# Patient Record
Sex: Female | Born: 1975 | Race: White | Hispanic: No | Marital: Married | Smoking: Never smoker
Health system: Southern US, Community
[De-identification: ages and names within clinical notes are randomized; demographics above are authoritative.]

## PROBLEM LIST (undated history)

## (undated) DIAGNOSIS — Z789 Other specified health status: Secondary | ICD-10-CM

## (undated) HISTORY — PX: MOUTH SURGERY: SHX715

---

## 2004-12-21 ENCOUNTER — Emergency Department: Payer: Self-pay | Admitting: Emergency Medicine

## 2006-05-15 ENCOUNTER — Emergency Department: Payer: Self-pay | Admitting: Emergency Medicine

## 2014-03-25 ENCOUNTER — Encounter: Payer: Self-pay | Admitting: Obstetrics and Gynecology

## 2014-05-06 ENCOUNTER — Encounter: Payer: Self-pay | Admitting: Obstetrics and Gynecology

## 2014-09-17 ENCOUNTER — Emergency Department
Admit: 2014-09-17 | Disposition: A | Discharge status: Home / Self Care | Payer: Self-pay | Admitting: Emergency Medicine

## 2014-09-17 ENCOUNTER — Observation Stay: Payer: Self-pay | Admitting: Obstetrics & Gynecology

## 2014-10-13 ENCOUNTER — Observation Stay
Admit: 2014-10-13 | Disposition: A | Discharge status: Home / Self Care | Payer: Self-pay | Attending: Obstetrics and Gynecology | Admitting: Obstetrics and Gynecology

## 2014-10-14 ENCOUNTER — Inpatient Hospital Stay
Admit: 2014-10-14 | Disposition: A | Discharge status: Home / Self Care | Payer: Self-pay | Attending: Certified Nurse Midwife | Admitting: Certified Nurse Midwife

## 2014-10-14 LAB — CBC WITH DIFFERENTIAL/PLATELET
BASOS ABS: 0 10*3/uL (ref 0.0–0.1)
Basophil %: 0.2 %
Eosinophil #: 0.1 10*3/uL (ref 0.0–0.7)
Eosinophil %: 0.6 %
HCT: 34.3 % — ABNORMAL LOW (ref 35.0–47.0)
HGB: 11.4 g/dL — ABNORMAL LOW (ref 12.0–16.0)
LYMPHS ABS: 1.3 10*3/uL (ref 1.0–3.6)
LYMPHS PCT: 14.5 %
MCH: 28.1 pg (ref 26.0–34.0)
MCHC: 33.1 g/dL (ref 32.0–36.0)
MCV: 85 fL (ref 80–100)
MONOS PCT: 5.2 %
Monocyte #: 0.5 x10 3/mm (ref 0.2–0.9)
NEUTROS PCT: 79.5 %
Neutrophil #: 7 10*3/uL — ABNORMAL HIGH (ref 1.4–6.5)
PLATELETS: 193 10*3/uL (ref 150–440)
RBC: 4.04 10*6/uL (ref 3.80–5.20)
RDW: 13.8 % (ref 11.5–14.5)
WBC: 8.8 10*3/uL (ref 3.6–11.0)

## 2014-10-14 LAB — DRUG SCREEN, URINE
Amphetamines, Ur Screen: NEGATIVE
BARBITURATES, UR SCREEN: NEGATIVE
Benzodiazepine, Ur Scrn: NEGATIVE
CANNABINOID 50 NG, UR ~~LOC~~: POSITIVE
COCAINE METABOLITE, UR ~~LOC~~: NEGATIVE
MDMA (ECSTASY) UR SCREEN: NEGATIVE
Methadone, Ur Screen: NEGATIVE
Opiate, Ur Screen: NEGATIVE
PHENCYCLIDINE (PCP) UR S: NEGATIVE
Tricyclic, Ur Screen: NEGATIVE

## 2014-10-15 LAB — HEMATOCRIT: HCT: 31.7 % — ABNORMAL LOW (ref 35.0–47.0)

## 2014-10-29 NOTE — H&P (Signed)
L&D Evaluation:  History:  HPI 39 yo G3P2002 with EDC=10/06/2014 presents at 5556w1d gestational age by 10 week ultrasound for IOL due to postdates. PNC at [facility] complicated by marijuana use (last positive UDS 3/26),  advanced maternal age, and GERD. Pelvis proven to 8#13 oz. LABS: A POS, RI, VNI, GBS negative. She notes positive fetal movement, no leakage of fluid, no vaginal bleeding, and occasional contractions. Desires to breast feed. [facility]   Presents with postdates for IOL   Patient's Medical History GERD   Patient's Surgical History wisdome teeth extraction   Medications Pre Natal Vitamins  nexium 20mg  po daily   Allergies Codeine, hydrocodone (nausea/emesis) though states that she has taken vicodin without problem   Social History notes +marijuana use. none recently. denies EtOH and tobacco use   Family History Non-Contributory   ROS:  ROS All systems were reviewed.  HEENT, CNS, GI, GU, Respiratory, CV, Renal and Musculoskeletal systems were found to be normal., x 10 systems reveiwed unless noted in HPI   Exam:  Vital Signs afebrile, normotensive, all others normal   General no apparent distress   Mental Status clear   Chest clear   Heart normal sinus rhythm   Abdomen gravid, non-tender   Estimated Fetal Weight 7.5 pounds   Fetal Position cephalic by leopolds   Back no CVAT   Edema no edema   Pelvic no external lesions, 3/70/-1/vtx on US. Clitoral ring-advised to remove   Mebranes Intact   FHT normal rate with no decels   FHT Description 110/mod var/+accels to 150s to 160s/no decels   Fetal Heart Rate 115    Ucx irregular   Skin ulcerative lesions (~0.5cm) on bilateral legs, no excoriations noted   Lymph no lymphadenopathy   Impression:  Impression 1) Intrauterine pregnancy at 2256w1d gestational age, 2) elderly multigravida, 3) reactive NST   Plan:  Plan EFM/NST, monitor contractions and for cervical change, Pitocin induction explained  to patient and she agrees with POM. Desires epidural when more active. UDS   Electronic Signatures: Trinna BalloonGutierrez, Rebecca Cairns L (CNM)  (Signed 25-Apr-16 09:31)  Authored: L&D Evaluation   Last Updated: 25-Apr-16 09:31 by Trinna BalloonGutierrez, Kaeleen Odom L (CNM)

## 2014-10-29 NOTE — H&P (Signed)
L&D Evaluation:  History Expanded:  HPI 39 yo G3P2002 at 7125w1d gestational age by 10 week ultrasound.  Pregnancy complicated by marijuana use, advanced maternal age. She presents today for an NST. She notes positive fetal movement, no leakage of fluid, no vaginal bleeding, and occasional contractions.   Gravida 3   Term 2   PreTerm 0   Abortion 0   Living 2   Blood Type (Maternal) A positive   Group B Strep Results Maternal (Result >5wks must be treated as unknown) negative  09/12/14   Maternal HIV Negative   Maternal Syphilis Ab Nonreactive   Maternal Varicella Non-Immune   Rubella Results (Maternal) immune   [facility]   Patient's Medical History GERD   Patient's Surgical History wisdome teeth extraction   Medications Pre Natal Vitamins  nexium 20mg  po daily   Allergies Codeine, hydrocodone (nausea/emesis) though states that she has taken vicodin without problem   Social History notes +marijuana use. none recently. denies EtOH and tobacco use   Family History Non-Contributory   ROS:  ROS All systems were reviewed.  HEENT, CNS, GI, GU, Respiratory, CV, Renal and Musculoskeletal systems were found to be normal., x 10 systems reveiwed unless noted in HPI   Exam:  Vital Signs afebrile, normotensive, all others normal   General no apparent distress   Mental Status clear   Chest clear   Heart normal sinus rhythm   Abdomen gravid, non-tender   Estimated Fetal Weight 7.5 pounds   Fetal Position cephalic by leopolds   Back no CVAT   Edema no edema   Pelvic no external lesions, 3/50/-2   Mebranes Intact   FHT normal rate with no decels   FHT Description 130/mod var/+accels/no decels   Ucx irregular, 2-3 q 10 min   Skin ulcerative lesions (~0.5cm) on bilateral legs, no excoriations noted   Lymph no lymphadenopathy   Impression:  Impression 1) Intrauterine pregnancy at 6525w1d gestational age, 2) elderly multigravida, 3) reactive NST    Plan:  Comments 1) labor: schedule for induction for tomorrow 8am.  Labor precautions reviewed.  G1 at 41 weeks, spontaneous vaginal delivery, female, weight 8lb13oz. G2 at 42 weeks, spontaneous vaginal delivery, female, 7 lb 8oz.  2) Fetal well being: reassuring with reactive NST, observed for 30 min, +15x15 accels noted, no decels, baseline and variability, as above 3) GBS negative 4) +history of marijuan use.  Recommend urine drug screen on admission tomorrow. 5) dispo: home. return at 8am tomorrow for induction.  Ok with L&D.   Follow Up Appointment already scheduled   Electronic Signatures: Conard NovakJackson, Dartanyon Frankowski D (MD)  (Signed 24-Apr-16 13:24)  Authored: L&D Evaluation   Last Updated: 24-Apr-16 13:24 by Conard NovakJackson, Ioana Louks D (MD)

## 2017-06-21 NOTE — L&D Delivery Note (Signed)
Obstetrical Delivery Note   Date of Delivery:   05/05/2018 Primary OB:   [facility] Gestational Age/EDD: 2526w0d (Dated by LMP) Antepartum complications: AMA, anemia, possible early alcohol use, marijuana use  Delivered By:   Farrel Connersolleen Leontae Bostock, CNM  Delivery Type:   spontaneous vaginal delivery  Procedure Details:   CTSP due to variable decelerations. Cervix 8-9/90%/0-+1. With next contraction, cervix became complete/C/+1 to +2. Mother pushed to deliver a vigorous female infant in [place] with nuchal cord x1 and right nuchal hand. Nuchal cord reduced on perineum. Baby with good cry.  Bulb suctioned then placed on mother's abdomen and dried. After delayed cord clamping, mother cut cord. Baby placed skin to skin with mother. Spontaneous delivery of intact placenta and 3 vessel cord. Some lower uterine atony treated with Cytotec 800 mcg PR. EBL 400 ml. No perineal or cervical lacerations Anesthesia:    epidural Intrapartum complications: VAriable deceleration with nuchal cord x 1 GBS:    negative Laceration:    none Episiotomy:    none Placenta:    Via active 3rd stage. To pathology: no Estimated Blood Loss:  400 ml Baby:    Liveborn female, Apgars 8/9, weight 6#8.1oz    Farrel Connersolleen Genie Mirabal, CNM

## 2017-10-04 LAB — OB RESULTS CONSOLE HIV ANTIBODY (ROUTINE TESTING): HIV: NONREACTIVE

## 2017-10-05 ENCOUNTER — Other Ambulatory Visit: Payer: Self-pay | Admitting: Advanced Practice Midwife

## 2017-10-05 DIAGNOSIS — Z369 Encounter for antenatal screening, unspecified: Secondary | ICD-10-CM

## 2017-10-05 LAB — OB RESULTS CONSOLE ABO/RH: RH TYPE: POSITIVE

## 2017-10-05 LAB — OB RESULTS CONSOLE HGB/HCT, BLOOD
HEMATOCRIT: 36 (ref 29–41)
HEMOGLOBIN: 12.5

## 2017-10-05 LAB — OB RESULTS CONSOLE RUBELLA ANTIBODY, IGM: Rubella: IMMUNE

## 2017-10-05 LAB — OB RESULTS CONSOLE HEPATITIS B SURFACE ANTIGEN: Hepatitis B Surface Ag: NEGATIVE

## 2017-10-05 LAB — OB RESULTS CONSOLE VARICELLA ZOSTER ANTIBODY, IGG: Varicella: NON-IMMUNE/NOT IMMUNE

## 2017-10-06 LAB — OB RESULTS CONSOLE GC/CHLAMYDIA
Chlamydia: NEGATIVE
GC PROBE AMP, GENITAL: NEGATIVE

## 2017-10-24 ENCOUNTER — Ambulatory Visit (HOSPITAL_BASED_OUTPATIENT_CLINIC_OR_DEPARTMENT_OTHER)
Admission: RE | Admit: 2017-10-24 | Discharge: 2017-10-24 | Disposition: A | Discharge status: Home / Self Care | Payer: Medicaid Other | Source: Ambulatory Visit | Attending: Obstetrics and Gynecology | Admitting: Obstetrics and Gynecology

## 2017-10-24 ENCOUNTER — Ambulatory Visit
Admission: RE | Admit: 2017-10-24 | Discharge: 2017-10-24 | Disposition: A | Discharge status: Home / Self Care | Payer: Medicaid Other | Source: Ambulatory Visit | Attending: Advanced Practice Midwife | Admitting: Advanced Practice Midwife

## 2017-10-24 VITALS — BP 104/66 | HR 79 | Temp 98.1°F | Resp 17 | Ht 67.0 in | Wt 149.8 lb

## 2017-10-24 DIAGNOSIS — O09521 Supervision of elderly multigravida, first trimester: Secondary | ICD-10-CM | POA: Insufficient documentation

## 2017-10-24 DIAGNOSIS — Z369 Encounter for antenatal screening, unspecified: Secondary | ICD-10-CM

## 2017-10-24 NOTE — Progress Notes (Addendum)
Referring Provider:   [facility] Department Length of Consultation: 40 minutes  Ms. Theriault was referred to [facility] for genetic counseling because of advanced maternal age.  The patient will be 42 years old at the time of delivery.  This note summarizes the information we discussed.    We explained that the chance of a chromosome abnormality increases with maternal age.  Chromosomes and examples of chromosome problems were reviewed.  Humans typically have 46 chromosomes in each cell, with half passed through each sperm and egg.  Any change in the number or structure of chromosomes can increase the risk of problems in the physical and mental development of a pregnancy.   Based upon age of the patient and the current gestational age, the chance of any chromosome abnormality was 1 in 34. The chance of Down syndrome, the most common chromosome problem associated with maternal age, was 1 in 67.  The risk of chromosome problems is in addition to the 3% general population risk for birth defects and mental retardation.  The greatest chance, of course, is that the baby would be born in good health.  We discussed the following prenatal screening and testing options for this pregnancy:  First trimester screening, which includes nuchal translucency ultrasound screen and first trimester maternal serum marker screening.  The nuchal translucency has approximately an 80% detection rate for Down syndrome and can be positive for other chromosome abnormalities as well as heart defects.  When combined with a maternal serum marker screening, the detection rate is up to 90% for Down syndrome and up to 97% for trisomy 18.     The chorionic villus sampling procedure is available for first trimester chromosome analysis.  This involves the withdrawal of a small amount of chorionic villi (tissue from the developing placenta).  Risk of pregnancy loss is estimated to be approximately 1  in 200 to 1 in 100 (0.5 to 1%).  There is approximately a 1% (1 in 100) chance that the CVS chromosome results will be unclear.  Chorionic villi cannot be tested for neural tube defects.     Maternal serum marker screening, a blood test that measures pregnancy proteins, can provide risk assessments for Down syndrome, trisomy 18, and open neural tube defects (spina bifida, anencephaly). Because it does not directly examine the fetus, it cannot positively diagnose or rule out these problems.  Targeted ultrasound uses high frequency sound waves to create an image of the developing fetus.  An ultrasound is often recommended as a routine means of evaluating the pregnancy.  It is also used to screen for fetal anatomy problems (for example, a heart defect) that might be suggestive of a chromosomal or other abnormality.   Amniocentesis involves the removal of a small amount of amniotic fluid from the sac surrounding the fetus with the use of a thin needle inserted through the maternal abdomen and uterus.  Ultrasound guidance is used throughout the procedure.  Fetal cells from amniotic fluid are directly evaluated and > 99.5% of chromosome problems and > 98% of open neural tube defects can be detected. This procedure is generally performed after the 15th week of pregnancy.  The main risks to this procedure include complications leading to miscarriage in less than 1 in 200 cases (0.5%).  We also reviewed the availability of cell free fetal DNA testing from maternal blood to determine whether or not the baby may have either Down syndrome, trisomy 40, or trisomy 30.  This test  utilizes a maternal blood sample and DNA sequencing technology to isolate circulating cell free fetal DNA from maternal plasma.  The fetal DNA can then be analyzed for DNA sequences that are derived from the three most common chromosomes involved in aneuploidy, chromosomes 13, 18, and 21.  If the overall amount of DNA is greater than the expected  level for any of these chromosomes, aneuploidy is suspected.  While we do not consider it a replacement for invasive testing and karyotype analysis, a negative result from this testing would be reassuring, though not a guarantee of a normal chromosome complement for the baby.  An abnormal result is certainly suggestive of an abnormal chromosome complement, though we would still recommend CVS or amniocentesis to confirm any findings from this testing.  Cystic Fibrosis and Spinal Muscular Atrophy (SMA) screening were also discussed with the patient. Both conditions are recessive, which means that both parents must be carriers in order to have a child with the disease.  Cystic fibrosis (CF) is one of the most common genetic conditions in persons of Caucasian ancestry.  This condition occurs in approximately 1 in 2,500 Caucasian persons and results in thickened secretions in the lungs, digestive, and reproductive systems.  For a baby to be at risk for having CF, both of the parents must be carriers for this condition.  Approximately 1 in 39 Caucasian persons is a carrier for CF.  Current carrier testing looks for the most common mutations in the gene for CF and can detect approximately 90% of carriers in the Caucasian population.  This means that the carrier screening can greatly reduce, but cannot eliminate, the chance for an individual to have a child with CF.  If an individual is found to be a carrier for CF, then carrier testing would be available for the partner. As part of [place]'s newborn screening profile, all babies born in the state of [place] will have a two-tier screening process.  Specimens are first tested to determine the concentration of immunoreactive trypsinogen (IRT).  The top 5% of specimens with the highest IRT values then undergo DNA testing using a panel of over 40 common CF mutations. SMA is a neurodegenerative disorder that leads to atrophy of skeletal muscle and overall  weakness.  This condition is also more prevalent in the Caucasian population, with 1 in 40-1 in 60 persons being a carrier and 1 in 6,000-1 in 10,000 children being affected.  There are multiple forms of the disease, with some causing death in infancy to other forms with survival into adulthood.  The genetics of SMA is complex, but carrier screening can detect up to 95% of carriers in the Caucasian population.  Similar to CF, a negative result can greatly reduce, but cannot eliminate, the chance to have a child with SMA.  We reviewed the detailed family history and pregnancy history obtained at her prior visit in 2015.  She reported no changes in the history since that time.  To review, the father of the baby had testicular cancer with surgery and radiation therapy in 2011.  We would not expected an increased risk for this pregnancy to have birth defects or genetic conditions related to this exposure.  She also reported that their 32 year old son has mild learning differences for which he receives help in school. There may be many reasons for learning delays, including some which are inherited and others which are not.  Without knowledge of the underlying reason, it is difficult to accurately  determine the chance for other family members to have learning differences. The remainder of the family history remains unremarkable for birth defects, developmental delays, recurrent pregnancy loss or known chromosome abnormalities.  Ms. Curless stated that this is her fourth pregnancy.  She reported no complications or exposures that would be expected to increase the risk for birth defects.  After consideration of the options, Ms. Iannaccone elected to proceed with an ultrasound only.  She declined aneuploidy screening today, though she expressed that she may reconsider based upon the results of her anatomy ultrasound at 18 weeks.  We also recommended a fetal echocardiogram after [redacted] weeks gestation due to maternal age of  42 years old.  She previously declined CF and SMA carrier screening.  An ultrasound was performed at the time of the visit.  The gestational age was consistent with 12 weeks.  Fetal anatomy could not be assessed due to early gestational age.  Please refer to the ultrasound report for details of that study.  She was scheduled to return to our clinic at [redacted] weeks gestation for a detailed anatomy ultrasound.  Ms. Laski was encouraged to call with questions or concerns.  We can be contacted at [phone].   Tests Ordered: none   Cherly Anderson, MS, CGC  Katrina Stack, MS, CGC performed an integral service incident to the physician's initial service.  I was physically present in the clinical area and was immediately available to render assistance.   Nicholis Stepanek C Alvenia Treese

## 2017-12-01 ENCOUNTER — Other Ambulatory Visit: Payer: Self-pay | Admitting: *Deleted

## 2017-12-01 DIAGNOSIS — O09521 Supervision of elderly multigravida, first trimester: Secondary | ICD-10-CM

## 2017-12-05 ENCOUNTER — Ambulatory Visit
Admission: RE | Admit: 2017-12-05 | Discharge: 2017-12-05 | Disposition: A | Discharge status: Home / Self Care | Payer: Medicaid Other | Source: Ambulatory Visit | Attending: Obstetrics & Gynecology | Admitting: Obstetrics & Gynecology

## 2017-12-05 DIAGNOSIS — Z3A18 18 weeks gestation of pregnancy: Secondary | ICD-10-CM | POA: Insufficient documentation

## 2017-12-05 DIAGNOSIS — O09522 Supervision of elderly multigravida, second trimester: Secondary | ICD-10-CM | POA: Diagnosis present

## 2017-12-05 DIAGNOSIS — O09521 Supervision of elderly multigravida, first trimester: Secondary | ICD-10-CM

## 2017-12-05 DIAGNOSIS — Z3689 Encounter for other specified antenatal screening: Secondary | ICD-10-CM | POA: Diagnosis present

## 2017-12-05 NOTE — Progress Notes (Signed)
Scheduled fetal echo with [facility] [place] for July 15th at 8am.  Patient aware of date, time and location.

## 2017-12-11 ENCOUNTER — Emergency Department
Admission: EM | Admit: 2017-12-11 | Discharge: 2017-12-11 | Disposition: A | Discharge status: Home / Self Care | Payer: Medicaid Other | Attending: Emergency Medicine | Admitting: Emergency Medicine

## 2017-12-11 DIAGNOSIS — Z3492 Encounter for supervision of normal pregnancy, unspecified, second trimester: Secondary | ICD-10-CM | POA: Insufficient documentation

## 2017-12-11 DIAGNOSIS — R1010 Upper abdominal pain, unspecified: Secondary | ICD-10-CM | POA: Insufficient documentation

## 2017-12-11 DIAGNOSIS — O26892 Other specified pregnancy related conditions, second trimester: Secondary | ICD-10-CM | POA: Insufficient documentation

## 2017-12-11 DIAGNOSIS — Z3A19 19 weeks gestation of pregnancy: Secondary | ICD-10-CM | POA: Diagnosis not present

## 2017-12-11 DIAGNOSIS — Z79899 Other long term (current) drug therapy: Secondary | ICD-10-CM | POA: Diagnosis not present

## 2017-12-11 LAB — URINALYSIS, COMPLETE (UACMP) WITH MICROSCOPIC
Bacteria, UA: NONE SEEN
Glucose, UA: NEGATIVE mg/dL
Hgb urine dipstick: NEGATIVE
Ketones, ur: 80 mg/dL — AB
LEUKOCYTES UA: NEGATIVE
Nitrite: NEGATIVE
PH: 5 (ref 5.0–8.0)
Protein, ur: 100 mg/dL — AB
Specific Gravity, Urine: 1.03 (ref 1.005–1.030)

## 2017-12-11 LAB — COMPREHENSIVE METABOLIC PANEL
ALT: 14 U/L (ref 14–54)
AST: 23 U/L (ref 15–41)
Albumin: 3.9 g/dL (ref 3.5–5.0)
Alkaline Phosphatase: 40 U/L (ref 38–126)
Anion gap: 12 (ref 5–15)
BUN: 10 mg/dL (ref 6–20)
CHLORIDE: 100 mmol/L — AB (ref 101–111)
CO2: 23 mmol/L (ref 22–32)
Calcium: 8.9 mg/dL (ref 8.9–10.3)
Creatinine, Ser: 0.77 mg/dL (ref 0.44–1.00)
GFR calc Af Amer: >60 mL/min (ref >60)
GFR calc non Af Amer: >60 mL/min (ref >60)
GLUCOSE: 141 mg/dL — AB (ref 65–99)
Potassium: 3.8 mmol/L (ref 3.5–5.1)
Sodium: 135 mmol/L (ref 135–145)
Total Bilirubin: 0.8 mg/dL (ref 0.3–1.2)
Total Protein: 7.9 g/dL (ref 6.5–8.1)

## 2017-12-11 LAB — CBC
HCT: 34.4 % — ABNORMAL LOW (ref 35.0–47.0)
Hemoglobin: 11.9 g/dL — ABNORMAL LOW (ref 12.0–16.0)
MCH: 29.7 pg (ref 26.0–34.0)
MCHC: 34.7 g/dL (ref 32.0–36.0)
MCV: 85.6 fL (ref 80.0–100.0)
Platelets: 350 10*3/uL (ref 150–440)
RBC: 4.02 MIL/uL (ref 3.80–5.20)
RDW: 12.8 % (ref 11.5–14.5)
WBC: 17.6 10*3/uL — AB (ref 3.6–11.0)

## 2017-12-11 LAB — HCG, QUANTITATIVE, PREGNANCY: hCG, Beta Chain, Quant, S: 15347 m[IU]/mL — ABNORMAL HIGH (ref <5)

## 2017-12-11 LAB — LIPASE, BLOOD: LIPASE: 24 U/L (ref 11–51)

## 2017-12-11 MED ORDER — DICYCLOMINE HCL 20 MG PO TABS: 20.0000 mg | ORAL_TABLET | Freq: Three times a day (TID) | ORAL | 0 refills | Status: DC | PRN

## 2017-12-11 MED ORDER — SUCRALFATE 1 G PO TABS: 1.0000 g | ORAL_TABLET | Freq: Four times a day (QID) | ORAL | 0 refills | Status: DC

## 2017-12-11 MED ORDER — ONDANSETRON 4 MG PO TBDP
4.0000 mg | ORAL_TABLET | Freq: Once | ORAL | Status: AC | PRN
  Administered 2017-12-11: 4 mg via ORAL

## 2017-12-11 MED ORDER — ONDANSETRON 4 MG PO TBDP
ORAL_TABLET | ORAL | Status: AC
  Filled 2017-12-11: qty 1

## 2017-12-11 MED ORDER — DICYCLOMINE HCL 10 MG PO CAPS
10.0000 mg | ORAL_CAPSULE | Freq: Once | ORAL | Status: AC
Start: 2017-12-11 — End: 2017-12-11
  Administered 2017-12-11: 10 mg via ORAL
  Filled 2017-12-11: qty 1

## 2017-12-11 MED ORDER — LIDOCAINE VISCOUS HCL 2 % MT SOLN
15.0000 mL | Freq: Once | OROMUCOSAL | Status: AC
Start: 2017-12-11 — End: 2017-12-11
  Administered 2017-12-11: 15 mL via OROMUCOSAL
  Filled 2017-12-11: qty 15

## 2017-12-11 NOTE — ED Provider Notes (Signed)
[facility] Emergency Department Provider Note  ____________________________________________   I have reviewed the triage vital signs and the nursing notes.   HISTORY  Chief Complaint Abdominal pain  History limited by: Not Limited   HPI Nancy Huang is a 42 y.o. female who presents to the emergency department today because of concerns for abdominal pain.  Patient states she is roughly 19-1/[redacted] weeks pregnant.  Over the weekend she has been having abdominal pain.  Is located across her upper abdomen.  Is associated with nausea vomiting.  She states it is severe.  She says that she had similar pain roughly 1 month ago although resolved on its own.  She denies any fevers.  Does have complaints of some constipation issues during this pregnancy.  Last had a bowel movement 2 days ago.   Per medical record review patient has a history of advanced maternal age.  History reviewed. No pertinent past medical history.  Patient Active Problem List   Diagnosis Date Noted  . Advanced maternal age in multigravida, first trimester     History reviewed. No pertinent surgical history.  Prior to Admission medications   Medication Sig Start Date End Date Taking? Authorizing Provider  calcium carbonate (TUMS - DOSED IN MG ELEMENTAL CALCIUM) 500 MG chewable tablet Chew 1 tablet by mouth daily.    [provider]  Prenatal Vit-Fe Fumarate-FA (PRENATAL MULTIVITAMIN) TABS tablet Take 1 tablet by mouth daily at 12 noon.    [provider]  ranitidine (ZANTAC) 75 MG tablet Take 75 mg by mouth 2 (two) times daily.    [provider]    Allergies Hydrocodone  No family history on file.  Social History Social History   Tobacco Use  . Smoking status: Never Smoker  . Smokeless tobacco: Never Used  Substance Use Topics  . Alcohol use: Not Currently  . Drug use: Not Currently    Review of Systems Constitutional: No fever/chills Eyes: No visual  changes. ENT: No sore throat. Cardiovascular: Denies chest pain. Respiratory: Denies shortness of breath. Gastrointestinal: Positive for abdominal pain, nausea and vomiting.  Genitourinary: Negative for dysuria. Musculoskeletal: Negative for back pain. Skin: Negative for rash. Neurological: Negative for headaches, focal weakness or numbness.  ____________________________________________   PHYSICAL EXAM:  VITAL SIGNS: ED Triage Vitals  Enc Vitals Group     BP 12/11/17 1531 114/80     Pulse Rate 12/11/17 1531 66     Resp 12/11/17 1531 18     Temp 12/11/17 1531 98.8 F (37.1 C)     Temp Source 12/11/17 1531 Oral     SpO2 12/11/17 1531 97 %     Weight 12/11/17 1533 147 lb (66.7 kg)     Height --      Head Circumference --      Peak Flow --      Pain Score 12/11/17 1533 9   Constitutional: Alert and oriented.  Eyes: Conjunctivae are normal.  ENT      Head: Normocephalic and atraumatic.      Nose: No congestion/rhinnorhea.      Mouth/Throat: Mucous membranes are moist.      Neck: No stridor. Hematological/Lymphatic/Immunilogical: No cervical lymphadenopathy. Cardiovascular: Normal rate, regular rhythm.  No murmurs, rubs, or gallops.  Respiratory: Normal respiratory effort without tachypnea nor retractions. Breath sounds are clear and equal bilaterally. No wheezes/rales/rhonchi. Gastrointestinal: Soft and minimally tender in the upper abdomen left greater than right. No rebound. No guarding.  Genitourinary: Deferred Musculoskeletal: Normal range  of motion in all extremities. No lower extremity edema. Neurologic:  Normal speech and language. No gross focal neurologic deficits are appreciated.  Skin:  Skin is warm, dry and intact. No rash noted. Psychiatric: Mood and affect are normal. Speech and behavior are normal. Patient exhibits appropriate insight and judgment.  ____________________________________________    LABS (pertinent positives/negatives)  Lipase 24 CMP wnl  except cl 100, glu 141 CBC wbc 17.6, hgb 11.9, plt 350 UA turbid, small bili, 80 ketones, 6-10 squamous epithelial cells  ____________________________________________   EKG  None  ____________________________________________    RADIOLOGY  None   ____________________________________________   PROCEDURES  Procedures  ____________________________________________   INITIAL IMPRESSION / ASSESSMENT AND PLAN / ED COURSE  Pertinent labs & imaging results that were available during my care of the patient were reviewed by me and considered in my medical decision making (see chart for details).   Presented to the emergency department today because of concerns for abdominal pain in the setting of being in [redacted] weeks pregnant.  Patient describes the pain as being located in the upper abdomen.  On exam is more tender in the left upper abdomen.  At this point differential would be broad.  Patient states she has a history of acid reflux would consider gastritis.  I think less likely biliary colic given left-sided nature.  Additionally think less likely appendicitis given where the patient is tender.  She does have a leukocytosis and the blood work.  Initially given GI cocktail with some relief.  She was then given Bentyl which took her pain down to 0 out of 10.  At this point I had a discussion with the patient about possibility of getting imaging.  Given that the pain seemed to resolve well with Bentyl and given location of pain this point do not feel emergent imaging would add much and potentially could be detrimental given pregnant status of the patient.  We did discuss strict return precautions.  Will plan on discharging with Bentyl and sucralfate.  Patient is already on Zantac.    ____________________________________________   FINAL CLINICAL IMPRESSION(S) / ED DIAGNOSES  Final diagnoses:  Pain of upper abdomen     Note: This dictation was prepared with Dragon dictation. Any  transcriptional errors that result from this process are unintentional     [name], MD 12/11/17 1909

## 2017-12-11 NOTE — Discharge Instructions (Addendum)
Please seek medical attention for any high fevers, chest pain, shortness of breath, change in behavior, persistent vomiting, bloody stool or any other new or concerning symptoms.  

## 2017-12-11 NOTE — ED Notes (Addendum)
Pt is 19/[redacted] weeks pregnant - she is c/o generalized abdominal pain (states it is not contraction pains) - the pt reports nausea and vomiting for the past 2-3 days - denies pain or frequency with urination - denies back pain - pt also reports that she has hard stools for the past week

## 2018-02-16 LAB — OB RESULTS CONSOLE HIV ANTIBODY (ROUTINE TESTING): HIV: NONREACTIVE

## 2018-02-17 LAB — OB RESULTS CONSOLE RPR: RPR: NONREACTIVE

## 2018-04-03 ENCOUNTER — Other Ambulatory Visit: Payer: Self-pay

## 2018-04-05 ENCOUNTER — Other Ambulatory Visit: Payer: Self-pay | Admitting: Nurse Practitioner

## 2018-04-05 ENCOUNTER — Ambulatory Visit (INDEPENDENT_AMBULATORY_CARE_PROVIDER_SITE_OTHER): Payer: Medicaid Other

## 2018-04-05 DIAGNOSIS — Z3493 Encounter for supervision of normal pregnancy, unspecified, third trimester: Secondary | ICD-10-CM

## 2018-04-05 DIAGNOSIS — Z3A35 35 weeks gestation of pregnancy: Secondary | ICD-10-CM

## 2018-04-05 DIAGNOSIS — O26843 Uterine size-date discrepancy, third trimester: Secondary | ICD-10-CM

## 2018-04-14 ENCOUNTER — Other Ambulatory Visit: Payer: Self-pay | Admitting: Obstetrics & Gynecology

## 2018-04-14 DIAGNOSIS — O0993 Supervision of high risk pregnancy, unspecified, third trimester: Secondary | ICD-10-CM

## 2018-04-15 LAB — OB RESULTS CONSOLE GC/CHLAMYDIA
Chlamydia: NEGATIVE
Gonorrhea: NEGATIVE

## 2018-04-15 LAB — OB RESULTS CONSOLE GBS: STREP GROUP B AG: NEGATIVE

## 2018-04-21 ENCOUNTER — Ambulatory Visit (INDEPENDENT_AMBULATORY_CARE_PROVIDER_SITE_OTHER): Payer: Medicaid Other

## 2018-04-21 DIAGNOSIS — Z3A38 38 weeks gestation of pregnancy: Secondary | ICD-10-CM

## 2018-04-21 DIAGNOSIS — O26843 Uterine size-date discrepancy, third trimester: Secondary | ICD-10-CM

## 2018-04-21 DIAGNOSIS — O0993 Supervision of high risk pregnancy, unspecified, third trimester: Secondary | ICD-10-CM

## 2018-05-04 ENCOUNTER — Encounter: Payer: Self-pay | Admitting: Obstetrics & Gynecology

## 2018-05-04 ENCOUNTER — Other Ambulatory Visit: Payer: Self-pay | Admitting: Obstetrics & Gynecology

## 2018-05-04 DIAGNOSIS — O09521 Supervision of elderly multigravida, first trimester: Secondary | ICD-10-CM

## 2018-05-04 NOTE — Progress Notes (Signed)
Pt scheduled for IOL 05/05/18 [facility] records to be faxed to L&D IOL due to 40 weeks and IUGR concerns Annamarie MajorPaul Ira Dougher, MD, Merlinda FrederickFACOG [facility], [facility] 05/04/2018  2:13 PM

## 2018-05-05 ENCOUNTER — Other Ambulatory Visit: Payer: Self-pay

## 2018-05-05 ENCOUNTER — Inpatient Hospital Stay
Admission: EM | Admit: 2018-05-05 | Discharge: 2018-05-07 | DRG: 806 | Disposition: A | Discharge status: Home / Self Care | Payer: Medicaid Other | Attending: Obstetrics & Gynecology | Admitting: Obstetrics & Gynecology

## 2018-05-05 ENCOUNTER — Encounter: Payer: Self-pay | Admitting: Obstetrics and Gynecology

## 2018-05-05 ENCOUNTER — Inpatient Hospital Stay: Payer: Medicaid Other | Admitting: Anesthesiology

## 2018-05-05 DIAGNOSIS — O9902 Anemia complicating childbirth: Secondary | ICD-10-CM | POA: Diagnosis not present

## 2018-05-05 DIAGNOSIS — O99324 Drug use complicating childbirth: Secondary | ICD-10-CM | POA: Diagnosis present

## 2018-05-05 DIAGNOSIS — O365990 Maternal care for other known or suspected poor fetal growth, unspecified trimester, not applicable or unspecified: Secondary | ICD-10-CM | POA: Diagnosis present

## 2018-05-05 DIAGNOSIS — O9081 Anemia of the puerperium: Secondary | ICD-10-CM | POA: Diagnosis not present

## 2018-05-05 DIAGNOSIS — F1290 Cannabis use, unspecified, uncomplicated: Secondary | ICD-10-CM | POA: Diagnosis present

## 2018-05-05 DIAGNOSIS — Z3A40 40 weeks gestation of pregnancy: Secondary | ICD-10-CM

## 2018-05-05 DIAGNOSIS — D62 Acute posthemorrhagic anemia: Secondary | ICD-10-CM | POA: Diagnosis not present

## 2018-05-05 DIAGNOSIS — O26893 Other specified pregnancy related conditions, third trimester: Secondary | ICD-10-CM | POA: Diagnosis present

## 2018-05-05 HISTORY — DX: Other specified health status: Z78.9

## 2018-05-05 LAB — CBC
HEMATOCRIT: 33.2 % — AB (ref 36.0–46.0)
HEMOGLOBIN: 10.6 g/dL — AB (ref 12.0–15.0)
MCH: 27 pg (ref 26.0–34.0)
MCHC: 31.9 g/dL (ref 30.0–36.0)
MCV: 84.7 fL (ref 80.0–100.0)
Platelets: 230 10*3/uL (ref 150–400)
RBC: 3.92 MIL/uL (ref 3.87–5.11)
RDW: 12.9 % (ref 11.5–15.5)
WBC: 8.3 10*3/uL (ref 4.0–10.5)
nRBC: 0 % (ref 0.0–0.2)

## 2018-05-05 LAB — TYPE AND SCREEN
ABO/RH(D): A POS
Antibody Screen: NEGATIVE

## 2018-05-05 LAB — URINE DRUG SCREEN, QUALITATIVE (ARMC ONLY)
Amphetamines, Ur Screen: NOT DETECTED
Barbiturates, Ur Screen: NOT DETECTED
Benzodiazepine, Ur Scrn: NOT DETECTED
COCAINE METABOLITE, UR ~~LOC~~: NOT DETECTED
Cannabinoid 50 Ng, Ur ~~LOC~~: POSITIVE — AB
MDMA (ECSTASY) UR SCREEN: NOT DETECTED
METHADONE SCREEN, URINE: NOT DETECTED
Opiate, Ur Screen: NOT DETECTED
Phencyclidine (PCP) Ur S: NOT DETECTED
TRICYCLIC, UR SCREEN: NOT DETECTED

## 2018-05-05 MED ORDER — PHENYLEPHRINE 40 MCG/ML (10ML) SYRINGE FOR IV PUSH (FOR BLOOD PRESSURE SUPPORT)
80.0000 ug | PREFILLED_SYRINGE | INTRAVENOUS | Status: DC | PRN
  Filled 2018-05-05: qty 5

## 2018-05-05 MED ORDER — FERROUS SULFATE 325 (65 FE) MG PO TABS
325.0000 mg | ORAL_TABLET | Freq: Every day | ORAL | Status: DC
  Administered 2018-05-06 – 2018-05-07 (×2): 325 mg via ORAL
  Filled 2018-05-05 (×2): qty 1

## 2018-05-05 MED ORDER — LACTATED RINGERS IV SOLN: 500.0000 mL | INTRAVENOUS | Status: DC | PRN

## 2018-05-05 MED ORDER — WITCH HAZEL-GLYCERIN EX PADS: 1.0000 "application " | MEDICATED_PAD | CUTANEOUS | Status: DC | PRN

## 2018-05-05 MED ORDER — ACETAMINOPHEN 325 MG PO TABS: 650.0000 mg | ORAL_TABLET | ORAL | Status: DC | PRN

## 2018-05-05 MED ORDER — SODIUM CHLORIDE 0.9 % IV SOLN
INTRAVENOUS | Status: DC | PRN
  Administered 2018-05-05 (×2): 5 mL via EPIDURAL

## 2018-05-05 MED ORDER — OXYTOCIN 40 UNITS IN LACTATED RINGERS INFUSION - SIMPLE MED
1.0000 m[IU]/min | INTRAVENOUS | Status: DC
  Administered 2018-05-05: 1 m[IU]/min via INTRAVENOUS

## 2018-05-05 MED ORDER — FENTANYL 2.5 MCG/ML W/ROPIVACAINE 0.15% IN NS 100 ML EPIDURAL (ARMC)
12.0000 mL/h | EPIDURAL | Status: DC
  Administered 2018-05-05: 12 mL/h via EPIDURAL
  Filled 2018-05-05: qty 100

## 2018-05-05 MED ORDER — DIPHENHYDRAMINE HCL 50 MG/ML IJ SOLN: 12.5000 mg | INTRAMUSCULAR | Status: DC | PRN

## 2018-05-05 MED ORDER — EPHEDRINE 5 MG/ML INJ
10.0000 mg | INTRAVENOUS | Status: DC | PRN
  Filled 2018-05-05: qty 2

## 2018-05-05 MED ORDER — SIMETHICONE 80 MG PO CHEW: 80.0000 mg | CHEWABLE_TABLET | ORAL | Status: DC | PRN

## 2018-05-05 MED ORDER — BUTORPHANOL TARTRATE 1 MG/ML IJ SOLN: 1.0000 mg | INTRAMUSCULAR | Status: DC | PRN

## 2018-05-05 MED ORDER — DIBUCAINE 1 % RE OINT: 1.0000 "application " | TOPICAL_OINTMENT | RECTAL | Status: DC | PRN

## 2018-05-05 MED ORDER — COCONUT OIL OIL: 1.0000 "application " | TOPICAL_OIL | Status: DC | PRN

## 2018-05-05 MED ORDER — TERBUTALINE SULFATE 1 MG/ML IJ SOLN: 0.2500 mg | Freq: Once | INTRAMUSCULAR | Status: DC | PRN

## 2018-05-05 MED ORDER — AMMONIA AROMATIC IN INHA
RESPIRATORY_TRACT | Status: AC
  Filled 2018-05-05: qty 10

## 2018-05-05 MED ORDER — LACTATED RINGERS IV SOLN
INTRAVENOUS | Status: DC
  Administered 2018-05-05: 01:00:00 via INTRAVENOUS
  Administered 2018-05-05: 125 mL/h via INTRAVENOUS

## 2018-05-05 MED ORDER — BENZOCAINE-MENTHOL 20-0.5 % EX AERO: 1.0000 "application " | INHALATION_SPRAY | CUTANEOUS | Status: DC | PRN

## 2018-05-05 MED ORDER — OXYTOCIN BOLUS FROM INFUSION: 500.0000 mL | Freq: Once | INTRAVENOUS | Status: DC

## 2018-05-05 MED ORDER — ONDANSETRON HCL 4 MG PO TABS: 4.0000 mg | ORAL_TABLET | ORAL | Status: DC | PRN

## 2018-05-05 MED ORDER — OXYTOCIN 40 UNITS IN LACTATED RINGERS INFUSION - SIMPLE MED
INTRAVENOUS | Status: AC
  Administered 2018-05-05: 1 m[IU]/min via INTRAVENOUS
  Filled 2018-05-05: qty 1000

## 2018-05-05 MED ORDER — OXYTOCIN 10 UNIT/ML IJ SOLN
INTRAMUSCULAR | Status: AC
  Filled 2018-05-05: qty 2

## 2018-05-05 MED ORDER — OXYTOCIN 40 UNITS IN LACTATED RINGERS INFUSION - SIMPLE MED: 2.5000 [IU]/h | INTRAVENOUS | Status: DC

## 2018-05-05 MED ORDER — LIDOCAINE HCL (PF) 1 % IJ SOLN
INTRAMUSCULAR | Status: DC | PRN
  Administered 2018-05-05 (×3): 1 mL via INTRADERMAL

## 2018-05-05 MED ORDER — LACTATED RINGERS IV SOLN
500.0000 mL | Freq: Once | INTRAVENOUS | Status: AC
  Administered 2018-05-05: 500 mL via INTRAVENOUS

## 2018-05-05 MED ORDER — SENNOSIDES-DOCUSATE SODIUM 8.6-50 MG PO TABS
2.0000 | ORAL_TABLET | ORAL | Status: DC
  Administered 2018-05-06 – 2018-05-07 (×2): 2 via ORAL
  Filled 2018-05-05 (×2): qty 2

## 2018-05-05 MED ORDER — LIDOCAINE HCL (PF) 1 % IJ SOLN
INTRAMUSCULAR | Status: AC
  Filled 2018-05-05: qty 30

## 2018-05-05 MED ORDER — MISOPROSTOL 200 MCG PO TABS
ORAL_TABLET | ORAL | Status: AC
  Administered 2018-05-05: 800 ug via RECTAL
  Filled 2018-05-05: qty 4

## 2018-05-05 MED ORDER — ONDANSETRON HCL 4 MG/2ML IJ SOLN
4.0000 mg | Freq: Four times a day (QID) | INTRAMUSCULAR | Status: DC | PRN
  Administered 2018-05-05 (×2): 4 mg via INTRAVENOUS
  Filled 2018-05-05 (×2): qty 2

## 2018-05-05 MED ORDER — LIDOCAINE-EPINEPHRINE (PF) 1.5 %-1:200000 IJ SOLN
INTRAMUSCULAR | Status: DC | PRN
  Administered 2018-05-05: 3 mL via EPIDURAL

## 2018-05-05 MED ORDER — ONDANSETRON HCL 4 MG/2ML IJ SOLN: 4.0000 mg | INTRAMUSCULAR | Status: DC | PRN

## 2018-05-05 MED ORDER — IBUPROFEN 600 MG PO TABS
600.0000 mg | ORAL_TABLET | Freq: Four times a day (QID) | ORAL | Status: DC
  Administered 2018-05-05 – 2018-05-06 (×4): 600 mg via ORAL
  Filled 2018-05-05 (×4): qty 1

## 2018-05-05 MED ORDER — PRENATAL MULTIVITAMIN CH
1.0000 | ORAL_TABLET | Freq: Every day | ORAL | Status: DC
  Administered 2018-05-05 – 2018-05-07 (×3): 1 via ORAL
  Filled 2018-05-05 (×3): qty 1

## 2018-05-05 NOTE — Anesthesia Procedure Notes (Signed)
Epidural Patient location during procedure: OB Start time: 05/05/2018 3:48 AM End time: 05/05/2018 4:03 AM  Staffing Anesthesiologist: Piscitello, Cleda MccreedyJoseph K, MD Performed: anesthesiologist   Preanesthetic Checklist Completed: patient identified, site marked, surgical consent, pre-op evaluation, timeout performed, IV checked, risks and benefits discussed and monitors and equipment checked  Epidural Patient position: sitting Prep: ChloraPrep Patient monitoring: heart rate, continuous pulse ox and blood pressure Approach: midline Location: L3-L4 Injection technique: LOR saline  Needle:  Needle type: Tuohy  Needle gauge: 17 G Needle length: 9 cm and 9 Needle insertion depth: 5 cm Catheter type: closed end flexible Catheter size: 19 Gauge Catheter at skin depth: 10 cm Test dose: negative and 1.5% lidocaine with Epi 1:200 K  Assessment Sensory level: T10 Events: blood not aspirated, injection not painful, no injection resistance, negative IV test and no paresthesia  Additional Notes 3 attempts.  First attempt was false loss at 3.5 cm but unable to thread catheter.  2nd attempt was false loss at 4 cm but unable to thread catheter. Pt. Evaluated and documentation done after procedure finished. Patient identified. Risks/Benefits/Options discussed with patient including but not limited to bleeding, infection, nerve damage, paralysis, failed block, incomplete pain control, headache, blood pressure changes, nausea, vomiting, reactions to medication both or allergic, itching and postpartum back pain. Confirmed with bedside nurse the patient's most recent platelet count. Confirmed with patient that they are not currently taking any anticoagulation, have any bleeding history or any family history of bleeding disorders. Patient expressed understanding and wished to proceed. All questions were answered. Sterile technique was used throughout the entire procedure. Please see nursing notes for vital  signs. Test dose was given through epidural catheter and negative prior to continuing to dose epidural or start infusion. Warning signs of high block given to the patient including shortness of breath, tingling/numbness in hands, complete motor block, or any concerning symptoms with instructions to call for help. Patient was given instructions on fall risk and not to get out of bed. All questions and concerns addressed with instructions to call with any issues or inadequate analgesia.   Patient tolerated the insertion well without immediate complications.Reason for block:procedure for pain

## 2018-05-05 NOTE — Progress Notes (Signed)
L&D Progress Note  42 year old [id number] presented early this Am for IOL at term.  EFW 2 weeks ago was 6#4oz Pelvis proven to 8#6 oz Currently on Pitocin and has an epidural  S: Comfortable with epidural. Was nauseous earlier and given Zofran with relief.  O: BP (!) 113/51   Pulse (!) 54   Temp 97.7 F (36.5 C) (Oral)   Resp 16   Ht 5\' 7"  (1.702 m)   Wt 68 kg   LMP 07/29/2017   SpO2 99%   BMI 23.49 kg/m    FHR: 120s with accelerations to 140s to 150s, moderate variability Toco: contractions every 2-3 min on Pitocin 14 miu/min Cervix: 4.5/75%/-1   A: IOL at 40 weeks  P: AROM for moderate clear fluid Titrate Pitocin to keep contractions at  Every 2-3 minutes apart. Bottle/ condoms ?vas

## 2018-05-05 NOTE — Anesthesia Preprocedure Evaluation (Signed)
Anesthesia Evaluation  Patient identified by MRN, date of birth, ID band Patient awake    Reviewed: Allergy & Precautions, H&P , NPO status , Patient's Chart, lab work & pertinent test results  History of Anesthesia Complications Negative for: history of anesthetic complications  Airway Mallampati: III  TM Distance: <3 FB Neck ROM: full    Dental  (+) Chipped, Poor Dentition   Pulmonary neg pulmonary ROS,           Cardiovascular Exercise Tolerance: Good (-) hypertensionnegative cardio ROS       Neuro/Psych    GI/Hepatic negative GI ROS, (+)     substance abuse  alcohol use and marijuana use,   Endo/Other    Renal/GU   negative genitourinary   Musculoskeletal   Abdominal   Peds  Hematology negative hematology ROS (+)   Anesthesia Other Findings Her pregnancy has been complicated by advanced maternal age, possible early EtOH use in pregnancy, anemia, marijuana use.   Past Medical History: No date: No known health problems  Past Surgical History: No date: MOUTH SURGERY  BMI    Body Mass Index:  23.49 kg/m      Reproductive/Obstetrics (+) Pregnancy                             Anesthesia Physical Anesthesia Plan  ASA: III  Anesthesia Plan: Epidural   Post-op Pain Management:    Induction:   PONV Risk Score and Plan:   Airway Management Planned:   Additional Equipment:   Intra-op Plan:   Post-operative Plan:   Informed Consent: I have reviewed the patients History and Physical, chart, labs and discussed the procedure including the risks, benefits and alternatives for the proposed anesthesia with the patient or authorized representative who has indicated his/her understanding and acceptance.     Plan Discussed with: Anesthesiologist  Anesthesia Plan Comments:         Anesthesia Quick Evaluation

## 2018-05-05 NOTE — Clinical Social Work Note (Signed)
Patient's urine drug screen positive for marijuana. CSW consulted for newborn. CSW is awaiting drug screen results for newborn prior to completing assessment. York SpanielMonica Khyler Urda MSW,LCSW [phone]

## 2018-05-05 NOTE — Plan of Care (Signed)
Reviewed plan of care with pt. All questions answered. Will monitor closely. 

## 2018-05-05 NOTE — Discharge Summary (Signed)
Physician Obstetric Discharge Summary  Patient ID: Nancy Huang MRN: [id number] DOB/AGE: [birth year] 42 y.o.   Date of Admission: 05/05/2018  Date of Discharge: 05/07/2018  Admitting Diagnosis: Induction of labor at 3696w0d  Secondary Diagnosis: None  Mode of Delivery: normal spontaneous vaginal delivery 05/05/2018     Discharge Diagnosis: No other diagnosis   Intrapartum Procedures: Atificial rupture of membranes and Pitocin induction   Post partum procedures: None  Complications: none   Brief Hospital Course  Nancy Huang is a [id number] who had a SVD on 05/05/2018;  for further details of this delivery, please refer to the delivery note.  Patient had an uncomplicated postpartum course.  By time of discharge on PPD#2, her pain was controlled on oral pain medications; she had appropriate lochia and was ambulating, voiding without difficulty and tolerating a regular diet.  She was deemed stable for discharge to home.     Labs: CBC Latest Ref Rng & Units 05/06/2018 05/05/2018 12/11/2017  WBC 4.0 - 10.5 K/uL 8.2 8.3 17.6(H)  Hemoglobin 12.0 - 15.0 g/dL 6.9(G9.4(L) 10.6(L) 11.9(L)  Hematocrit 36.0 - 46.0 % 29.5(L) 33.2(L) 34.4(L)  Platelets 150 - 400 K/uL 189 230 350   A POS  Physical exam:  Blood pressure 133/67, pulse (!) 54, temperature 97.8 F (36.6 C), temperature source Oral, resp. rate 18, height 5\' 7"  (1.702 m), weight 68 kg, last menstrual period 07/29/2017, SpO2 97 %. General: alert and no distress Lochia: appropriate Abdomen: soft, NT Uterine Fundus: firm Extremities: No evidence of DVT seen on physical exam. No lower extremity edema.  Discharge Instructions: Per After Visit Summary. Activity: Advance as tolerated. Pelvic rest for 6 weeks.  Also refer to Discharge Instructions Diet: Regular Medications: Allergies as of 05/07/2018      Reactions   Hydrocodone Other (See Comments)   Shaky      Medication List    STOP taking these medications   dicyclomine  20 MG tablet Commonly known as:  BENTYL   sucralfate 1 g tablet Commonly known as:  CARAFATE     TAKE these medications   calcium carbonate 500 MG chewable tablet Commonly known as:  TUMS - dosed in mg elemental calcium Chew 1 tablet by mouth daily.   prenatal multivitamin Tabs tablet Take 1 tablet by mouth daily at 12 noon.   ranitidine 75 MG tablet Commonly known as:  ZANTAC Take 75 mg by mouth 2 (two) times daily.      Outpatient follow up:  Follow-up Information    [facility], [facility]. Schedule an appointment as soon as possible for a visit.   Why:  for 6 week postpartum check up Contact information: [address] [place] [phone]          Postpartum contraception: condoms  Discharged Condition: good  Discharged to: home   Newborn Data: Kayleigh/ 6#8.1 oz Disposition:home with mother  Apgars: APGAR (1 MIN): 8   APGAR (5 MINS): 9   APGAR (10 MINS):    Baby Feeding: Bottle  Oswaldo ConroyJacelyn Y Angelli Baruch, CNM 05/07/2018 9:30 AM

## 2018-05-05 NOTE — H&P (Signed)
OB History & Physical   History of Present Illness:  Chief Complaint: presents for induction of labor  HPI:  Nancy Huang is a 42 y.o. [phone] female at [redacted]w[redacted]d dated by LMP consistent with 12 week ultrasound.  Her pregnancy has been complicated by advanced maternal age, possible early EtOH use in pregnancy, anemia, marijuana use.    She reports contractions, not timed.   She denies leakage of fluid.   She denies vaginal bleeding.   She reports fetal movement.    Maternal Medical History:   Past Medical History:  Diagnosis Date  . No known health problems     Past Surgical History:  Procedure Laterality Date  . MOUTH SURGERY      Allergies  Allergen Reactions  . Hydrocodone Other (See Comments)    Shaky    Prior to Admission medications   Medication Sig Start Date End Date Taking? Authorizing Provider  Prenatal Vit-Fe Fumarate-FA (PRENATAL MULTIVITAMIN) TABS tablet Take 1 tablet by mouth daily at 12 noon.   Yes [provider]    OB History  Gravida Para Term Preterm AB Living  4 3 3     3   SAB TAB Ectopic Multiple Live Births          3    # Outcome Date GA Lbr Len/2nd Weight Sex Delivery Anes PTL Lv  4 Current           3 Term         LIV  2 Term         LIV  1 Term         LIV    Prenatal care site: [facility]  Social History: She  reports that she has never smoked. She has never used smokeless tobacco. She reports that she drank alcohol. She reports that she has current or past drug history.  Family History: family history includes Cancer in her mother; Diabetes in her father.   Review of Systems: Negative x 10 systems reviewed except as noted in the HPI.    Physical Exam:  Vital Signs: BP 114/69 (BP Location: Left Arm)   Pulse 60   Temp 97.7 F (36.5 C) (Oral)   Resp 16   Ht 5\' 7"  (1.702 m)   Wt 68 kg   LMP 07/29/2017   BMI 23.49 kg/m  Constitutional: Well nourished, well developed female in no acute distress.  HEENT: normal Skin: Warm and  dry.  Cardiovascular: Regular rate and rhythm.   Extremity: no edema  Respiratory: Clear to auscultation bilateral. Normal respiratory effort Abdomen: FHT present and gravid/nt Back: no CVAT Neuro: DTRs 2+, Cranial nerves grossly intact Psych: Alert and Oriented x3. No memory deficits. Normal mood and affect.  MS: normal gait, normal bilateral lower extremity ROM/strength/stability.  Pelvic exam: (female chaperone present) 3cm per RN  Pertinent Results:  Prenatal Labs: Blood type/Rh A positive  Antibody screen negative  Rubella Immune  Varicella Not immune    RPR NR  HBsAg negative  HIV negative  GC negative  Chlamydia negative  1 hour GTT 98  3 hour GTT n/a  GBS negative on 04/15/2018   Baseline FHR: 120 beats/min   Variability: moderate   Accelerations: present   Decelerations: present (possible deceleration, timing with contraction not apparent due to lack of tocometry) Contractions: present frequency: 3-4 q 10 min Overall assessment: cat 1 overall, with possible periods of cat 2  Assessment:  Nancy Huang is a 52 y.o. [phone] female  at 7671w0d with IOL.   Plan:  1. Admit to Labor & Delivery  2. CBC, T&S, Clrs, IVF 3. GBS negative.   4. Fetwal well-being: reassuring overall 5. IOL with pitocin   Thomasene MohairStephen Erynne Kealey, MD 05/05/2018 2:44 AM

## 2018-05-06 LAB — CBC
HCT: 29.5 % — ABNORMAL LOW (ref 36.0–46.0)
HEMOGLOBIN: 9.4 g/dL — AB (ref 12.0–15.0)
MCH: 27 pg (ref 26.0–34.0)
MCHC: 31.9 g/dL (ref 30.0–36.0)
MCV: 84.8 fL (ref 80.0–100.0)
NRBC: 0 % (ref 0.0–0.2)
Platelets: 189 10*3/uL (ref 150–400)
RBC: 3.48 MIL/uL — AB (ref 3.87–5.11)
RDW: 12.8 % (ref 11.5–15.5)
WBC: 8.2 10*3/uL (ref 4.0–10.5)

## 2018-05-06 LAB — RPR: RPR: NONREACTIVE

## 2018-05-06 MED ORDER — OXYCODONE-ACETAMINOPHEN 5-325 MG PO TABS
1.0000 | ORAL_TABLET | Freq: Once | ORAL | Status: AC
  Administered 2018-05-06: 1 via ORAL
  Filled 2018-05-06: qty 1

## 2018-05-06 MED ORDER — VARICELLA VIRUS VACCINE LIVE 1350 PFU/0.5ML IJ SUSR
0.5000 mL | INTRAMUSCULAR | Status: AC | PRN
  Administered 2018-05-07: 0.5 mL via SUBCUTANEOUS
  Filled 2018-05-06: qty 0.5

## 2018-05-06 MED ORDER — OXYCODONE-ACETAMINOPHEN 5-325 MG PO TABS
1.0000 | ORAL_TABLET | Freq: Four times a day (QID) | ORAL | Status: DC | PRN
  Administered 2018-05-06: 1 via ORAL
  Filled 2018-05-06: qty 1

## 2018-05-06 MED ORDER — IBUPROFEN 800 MG PO TABS
800.0000 mg | ORAL_TABLET | Freq: Three times a day (TID) | ORAL | Status: DC
  Administered 2018-05-06 – 2018-05-07 (×3): 800 mg via ORAL
  Filled 2018-05-06 (×3): qty 1

## 2018-05-06 NOTE — Anesthesia Postprocedure Evaluation (Signed)
Anesthesia Post Note  Patient: Nancy Huang  Procedure(s) Performed: AN AD HOC LABOR EPIDURAL  Patient location during evaluation: Mother Baby Anesthesia Type: Epidural Level of consciousness: awake and alert Pain management: pain level controlled Vital Signs Assessment: post-procedure vital signs reviewed and stable Respiratory status: spontaneous breathing, nonlabored ventilation and respiratory function stable Cardiovascular status: stable Postop Assessment: no headache, no backache and epidural receding Anesthetic complications: no Comments: ctsp regarding low back pain at site of epidural placement.  Eval reveals mild erythema, no purulence.  No edema.  LE strenth and function appropriate.  Rec. Ice instead of current heat pad and if not better in 3 days to follow up with anesthesia.  JA     Last Vitals:  Vitals:   05/06/18 0331 05/06/18 0827  BP: 125/75 (!) 146/76  Pulse: (!) 53 62  Resp: 18 18  Temp: 36.5 C 36.6 C  SpO2: 98% 100%    Last Pain:  Vitals:   05/06/18 0827  TempSrc: Oral  PainSc:                  Yevette EdwardsJames G 

## 2018-05-06 NOTE — Progress Notes (Signed)
PPD#1 SVD Subjective:  Sitting up in bed, eating breakfast. States she has greater than usual pain in her back at the epidural site. Using heating pad and requesting additional pain medication. Voiding without difficulty. Tolerating a regular diet. Ambulating well.  Objective:   Blood pressure (!) 146/76, pulse 62, temperature 97.8 F (36.6 C), temperature source Oral, resp. rate 18, height 5\' 7"  (1.702 m), weight 68 kg, last menstrual period 07/29/2017, SpO2 100 %.  General: NAD Pulmonary: no increased work of breathing Abdomen: non-distended, non-tender Uterus:  fundus firm, lochia appropriate Extremities: no edema, no erythema, no tenderness, no signs of DVT  Results for orders placed or performed during the hospital encounter of 05/05/18 (from the past 72 hour(s))  CBC     Status: Abnormal   Collection Time: 05/05/18  1:19 AM  Result Value Ref Range   WBC 8.3 4.0 - 10.5 K/uL   RBC 3.92 3.87 - 5.11 MIL/uL   Hemoglobin 10.6 (L) 12.0 - 15.0 g/dL   HCT 16.1 (L) 09.6 - 04.5 %   MCV 84.7 80.0 - 100.0 fL   MCH 27.0 26.0 - 34.0 pg   MCHC 31.9 30.0 - 36.0 g/dL   RDW 40.9 81.1 - 91.4 %   Platelets 230 150 - 400 K/uL   nRBC 0.0 0.0 - 0.2 %    Comment: Performed at [facility], [address] [place], [place] [address]  RPR     Status: None   Collection Time: 05/05/18  1:19 AM  Result Value Ref Range   RPR Ser Ql Non Reactive Non Reactive    Comment: (NOTE) Performed At: [facility] [address] [place], [place] [id number] Jolene Schimke MD QI:[id number]   Type and screen     Status: None   Collection Time: 05/05/18  1:19 AM  Result Value Ref Range   ABO/RH(D) A POS    Antibody Screen NEG    Sample Expiration      05/08/2018 Performed at [facility], [address] [place], [place] [address]   Urine Drug Screen, Qualitative ([facility] only)     Status: Abnormal   Collection Time: 05/05/18  1:19 AM  Result Value Ref Range   Tricyclic, Ur Screen  NONE DETECTED NONE DETECTED   Amphetamines, Ur Screen NONE DETECTED NONE DETECTED   MDMA (Ecstasy)Ur Screen NONE DETECTED NONE DETECTED   Cocaine Metabolite,Ur [place] NONE DETECTED NONE DETECTED   Opiate, Ur Screen NONE DETECTED NONE DETECTED   Phencyclidine (PCP) Ur S NONE DETECTED NONE DETECTED   Cannabinoid 50 Ng, Ur [place] POSITIVE (A) NONE DETECTED   Barbiturates, Ur Screen NONE DETECTED NONE DETECTED   Benzodiazepine, Ur Scrn NONE DETECTED NONE DETECTED   Methadone Scn, Ur NONE DETECTED NONE DETECTED    Comment: (NOTE) Tricyclics + metabolites, urine    Cutoff 1000 ng/mL Amphetamines + metabolites, urine  Cutoff 1000 ng/mL MDMA (Ecstasy), urine              Cutoff 500 ng/mL Cocaine Metabolite, urine          Cutoff 300 ng/mL Opiate + metabolites, urine        Cutoff 300 ng/mL Phencyclidine (PCP), urine         Cutoff 25 ng/mL Cannabinoid, urine                 Cutoff 50 ng/mL Barbiturates + metabolites, urine  Cutoff 200 ng/mL Benzodiazepine, urine  Cutoff 200 ng/mL Methadone, urine                   Cutoff 300 ng/mL The urine drug screen provides only a preliminary, unconfirmed analytical test result and should not be used for non-medical purposes. Clinical consideration and professional judgment should be applied to any positive drug screen result due to possible interfering substances. A more specific alternate chemical method must be used in order to obtain a confirmed analytical result. Gas chromatography / mass spectrometry (GC/MS) is the preferred confirmat ory method. Performed at [facility], [address] [place], [place] [address]   CBC     Status: Abnormal   Collection Time: 05/06/18  5:07 AM  Result Value Ref Range   WBC 8.2 4.0 - 10.5 K/uL   RBC 3.48 (L) 3.87 - 5.11 MIL/uL   Hemoglobin 9.4 (L) 12.0 - 15.0 g/dL   HCT 19.129.5 (L) 47.836.0 - 29.546.0 %   MCV 84.8 80.0 - 100.0 fL   MCH 27.0 26.0 - 34.0 pg   MCHC 31.9 30.0 - 36.0 g/dL   RDW 62.112.8 30.811.5 -  65.715.5 %   Platelets 189 150 - 400 K/uL   nRBC 0.0 0.0 - 0.2 %    Comment: Performed at [facility], [address] [place], [place] [address]    Assessment:   42 y.o. [phone] [id number] postpartum day # 1 in stable condition.  Plan:   1) Acute blood loss anemia - hemodynamically stable and asymptomatic - PO ferrous sulfate  2) Blood Type --/--/A POS (11/15 0119) /   3) Rubella Immune (04/17 0000) / Varicella vaccine ordered / TDAP status: received 02/16/18  4) Formula feeding  5) Contraception: Condoms/? vasectomy  6) Disposition: continue postpartum care.  RN will request evaluation from anesthesia for epidural site.  Marcelyn BruinsJacelyn Schmid, CNM 05/06/2018  9:34 AM

## 2018-05-07 ENCOUNTER — Encounter: Payer: Self-pay | Admitting: Lactation Services

## 2018-05-07 NOTE — Discharge Instructions (Signed)
°Vaginal Delivery, Care After °Refer to this sheet in the next few weeks. These discharge instructions provide you with information on caring for yourself after delivery. Your caregiver may also give you specific instructions. Your treatment has been planned according to the most current medical practices available, but problems sometimes occur. Call your caregiver if you have any problems or questions after you go home. °HOME CARE INSTRUCTIONS °1. Take over-the-counter or prescription medicines only as directed by your caregiver or pharmacist. °2. Do not drink alcohol, especially if you are breastfeeding or taking medicine to relieve pain. °3. Do not smoke tobacco. °4. Continue to use good perineal care. Good perineal care includes: °1. Wiping your perineum from back to front °2. Keeping your perineum clean. °3. You can do sitz baths twice a day, to help keep this area clean °5. Do not use tampons, douche or have sex for 6 weeks °6. Shower only and avoid sitting in submerged water, aside from sitz baths °7. Wear a well-fitting bra that provides breast support. °8. Eat healthy foods. °9. Drink enough fluids to keep your urine clear or pale yellow. °10. Eat high-fiber foods such as whole grain cereals and breads, brown rice, beans, and fresh fruits and vegetables every day. These foods may help prevent or relieve constipation. °11. Avoid constipation with high fiber foods or medications, such as miralax or metamucil °12. Follow your caregiver's recommendations regarding resumption of activities such as climbing stairs, driving, lifting, exercising, or traveling. °13. Talk to your caregiver about resuming sexual activities. Resumption of sexual activities after 6 weeks is dependent upon your risk of infection, your rate of healing, and your comfort and desire to resume sexual activity. °14. Try to have someone help you with your household activities and your newborn for at least a few days after you leave the  hospital. °15. Rest as much as possible. Try to rest or take a nap when your newborn is sleeping. °16. Increase your activities gradually. °17. Keep all of your scheduled postpartum appointments. It is very important to keep your scheduled follow-up appointments. At these appointments, your caregiver will be checking to make sure that you are healing physically and emotionally. °SEEK MEDICAL CARE IF:  °· You are passing large clots from your vagina. Save any clots to show your caregiver. °· You have a foul smelling discharge from your vagina. °· You have trouble urinating. °· You are urinating frequently. °· You have pain when you urinate. °· You have a change in your bowel movements. °· You have increasing redness, pain, or swelling near your vaginal incision (episiotomy) or vaginal tear. °· You have pus draining from your episiotomy or vaginal tear. °· Your episiotomy or vaginal tear is separating. °· You have painful, hard, or reddened breasts. °· You have a severe headache. °· You have blurred vision or see spots. °· You feel sad or depressed. °· You have thoughts of hurting yourself or your newborn. °· You have questions about your care, the care of your newborn, or medicines. °· You are dizzy or light-headed. °· You have a rash. °· You have nausea or vomiting. °· You were breastfeeding and have not had a menstrual period within 12 weeks after you stopped breastfeeding. °· You are not breastfeeding and have not had a menstrual period by the 12th week after delivery. °· You have a fever of 100.5 or more °SEEK IMMEDIATE MEDICAL CARE IF:  °· You have persistent pain. °· You have chest pain. °· You have shortness   of breath.  You faint.  You have leg pain.  You have stomach pain.  Your vaginal bleeding saturates two or more sanitary pads in 1 hour. MAKE SURE YOU:   Understand these instructions.  Will watch your condition.  Will get help right away if you are not doing well or get worse. Document  Released: 06/04/2000 Document Revised: 10/22/2013 Document Reviewed: 02/02/2012 [facility] Patient Information 2015 [place] [place]. This information is not intended to replace advice given to you by your health care provider. Make sure you discuss any questions you have with your health care provider.  Sitz Bath A sitz bath is a warm water bath taken in the sitting position. The water covers only the hips and butt (buttocks). We recommend using one that fits in the toilet, to help with ease of use and cleanliness. It may be used for either healing or cleaning purposes. Sitz baths are also used to relieve pain, itching, or muscle tightening (spasms). The water may contain medicine. Moist heat will help you heal and relax.  HOME CARE  Take 3 to 4 sitz baths a day. 18. Fill the bathtub half-full with warm water. 19. Sit in the water and open the drain a little. 20. Turn on the warm water to keep the tub half-full. Keep the water running constantly. 21. Soak in the water for 15 to 20 minutes. 22. After the sitz bath, pat the affected area dry. GET HELP RIGHT AWAY IF: You get worse instead of better. Stop the sitz baths if you get worse. MAKE SURE YOU:  Understand these instructions.  Will watch your condition.  Will get help right away if you are not doing well or get worse. Document Released: 07/15/2004 Document Revised: 03/01/2012 Document Reviewed: 10/05/2010 [facility] Patient Information 2015 [place], [place]. This information is not intended to replace advice given to you by your health care provider. Make sure you discuss any questions you have with your health care provider.  Call your doctor for increased pain or vaginal bleeding, temperature above 100.4, depression, or concerns.  Continue prenatal vitamin and iron.  No strenuous activity or heavy lifting for 6 weeks.  No intercourse, tampons, or douching for 6 weeks.  No tub baths- showers only.  No driving for 2 weeks.

## 2018-05-07 NOTE — Progress Notes (Signed)
Prenatal records indicate that pt received TDaP vaccine on 02/16/18 and Influenza vaccine on 03/30/18. Reynold BowenSusan Paisley Essica Kiker, RN 05/07/2018 10:26 AM

## 2018-05-07 NOTE — Progress Notes (Signed)
Discharge instructions provided.  Pt and sig other verbalize understanding of all instructions and follow-up care.  Pt discharged to home with infant at 1402 on 05/07/18 via wheelchair by volunteer. Reynold BowenSusan Paisley Austen Wygant, RN 05/07/2018 2:15 PM

## 2019-12-30 IMAGING — US US MFM OB DETAIL+14 WK
1 series · 12 of 28 positions shown · non-contrast
Comparison: none

PATIENT INFO:

PERFORMED BY:
SERVICE(S) PROVIDED:
INDICATIONS:
18 weeks gestation of pregnancy
AMA
FETAL EVALUATION:
Num Of Fetuses:     1
Fetal Heart         -153
Rate(bpm):
Cardiac Activity:   Present
Presentation:       Variable
Placenta:           Posterior
P. Cord Insertion:  Normal
Amniotic Fluid
AFI FV:      Normal
BIOMETRY:
BPD:      40.6  mm     G. Age:  18w 2d         46  %    CI:        74.33   %    70 - 86
FL/HC:      16.9   %    15.8 - 18
HC:     149.5   mm     G. Age:  18w 0d         24  %    HC/AC:      1.17        1.07 -
AC:     127.8   mm     G. Age:  18w 3d         44  %    FL/BPD:     62.1   %
FL:       25.2  mm     G. Age:  17w 5d         18  %    FL/AC:      19.7   %    20 - 24
HUM:      25.3  mm     G. Age:  18w 0d         39  %
CER:      17.4  mm     G. Age:  17w 3d         23  %
NFT:       2.9  mm
CM:        4.4  mm
Est. FW:     220   g      0 lb 8 oz     26  %
m
GESTATIONAL AGE:
LMP:           18w 3d        Date:  07/29/17                 EDD:   05/05/18
U/S Today:     18w 1d                                        EDD:   05/07/18
Best:          18w 3d     Det. By:  LMP  (07/29/17)           EDD:  05/05/18
ANATOMY:
Cranium:               Within Normal          Aortic Arch:            Normal appearance
Limits
Cavum:                 CSP visualized         Ductal Arch:            Normal appearance
Ventricles:            Normal appearance      Diaphragm:              Within Normal
Cerebellum:            Within Normal          Stomach:                Seen
Posterior Fossa:       Within Normal          Abdomen:                Within Normal
Limits                                         Limits
Nuchal Fold:           Within Normal          Abdominal Wall:         Normal appearance
Face:                  Orbits visualized      Cord Vessels:           3 vessels
Lips:                  Normal appearance      Kidneys:                Normal appearance
Thoracic:              Within Normal          Bladder:                Seen
Heart:                 4-Chamber view         Spine:                  Normal appearance
appears normal
RVOT:                  Limited views          Upper Extremities:      Visualized
LVOT:                  Limited views          Lower Extremities:      Visualized
CERVIX UTERUS ADNEXA:
Cervix
Length:           3.32  cm.

[Series 1: us mfm ob detail+14 wk · 0.20mm/px · 12 of 97 slices shown]
[im 4/97]
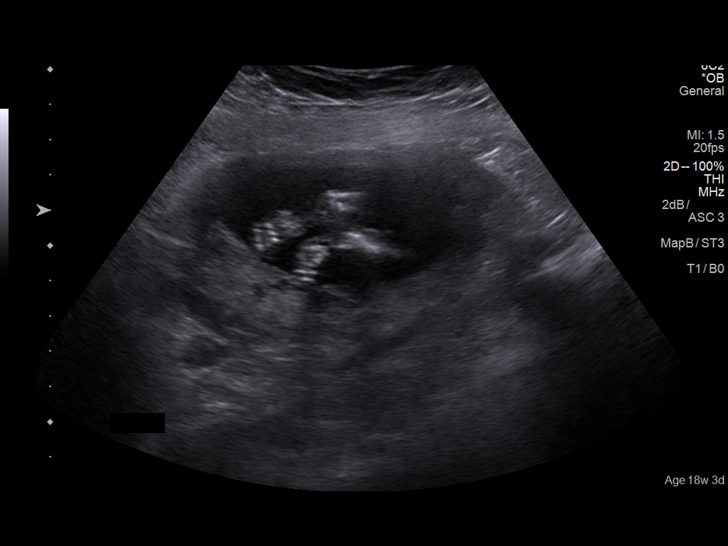
[im 11/97]
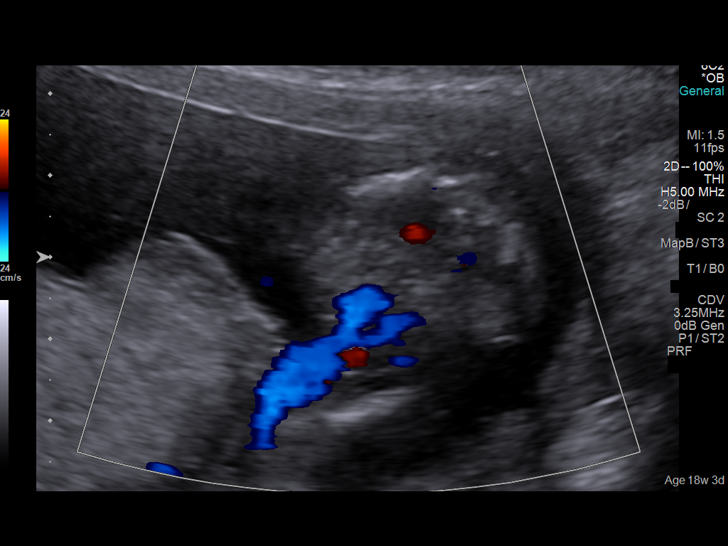
[im 18/97]
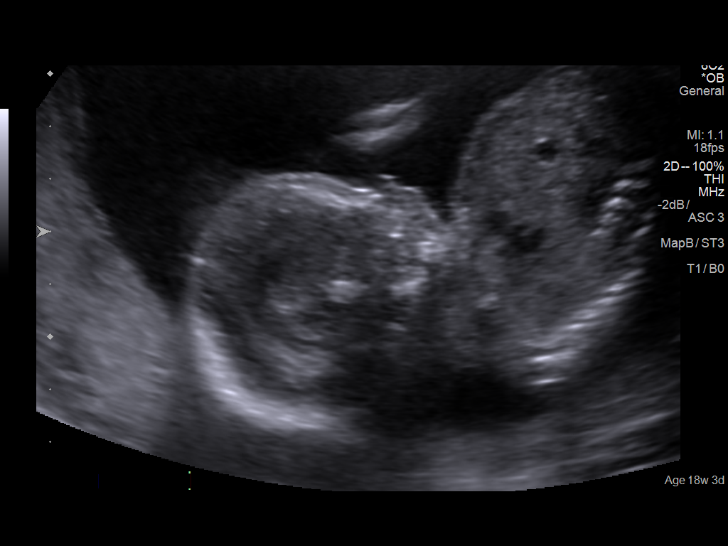
[im 29/97]
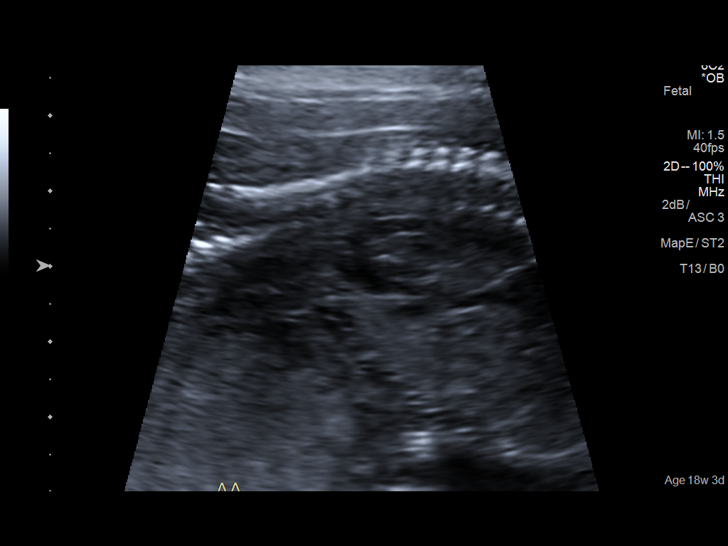
[im 36/97]
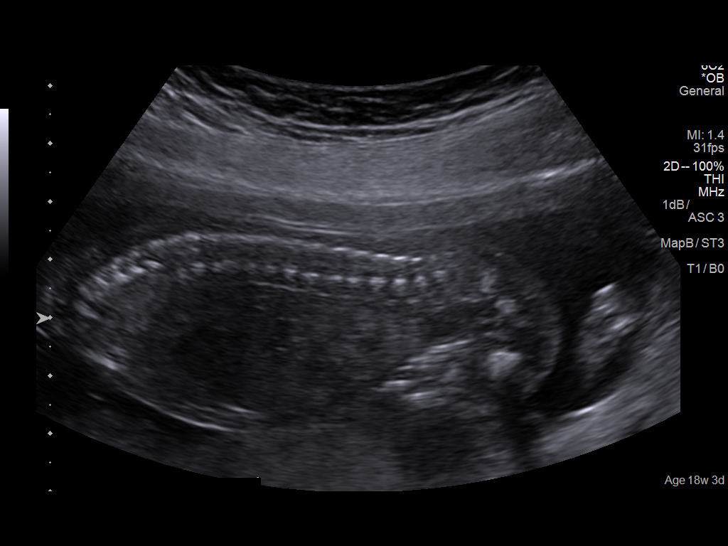
[im 43/97]
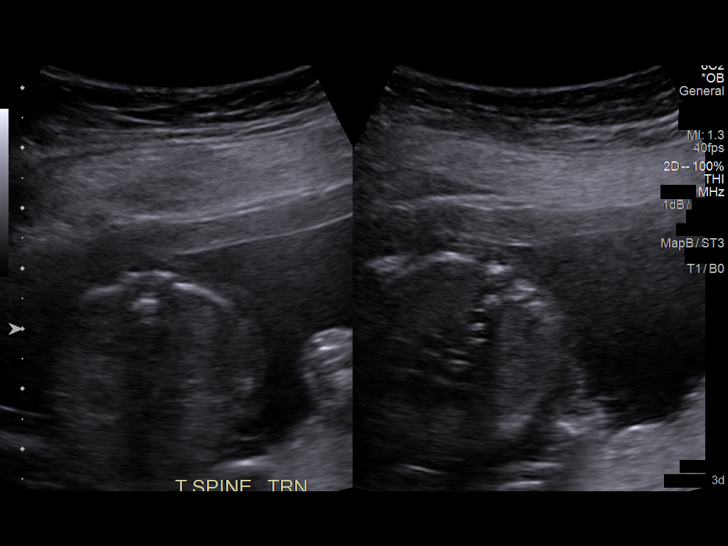
[im 54/97]
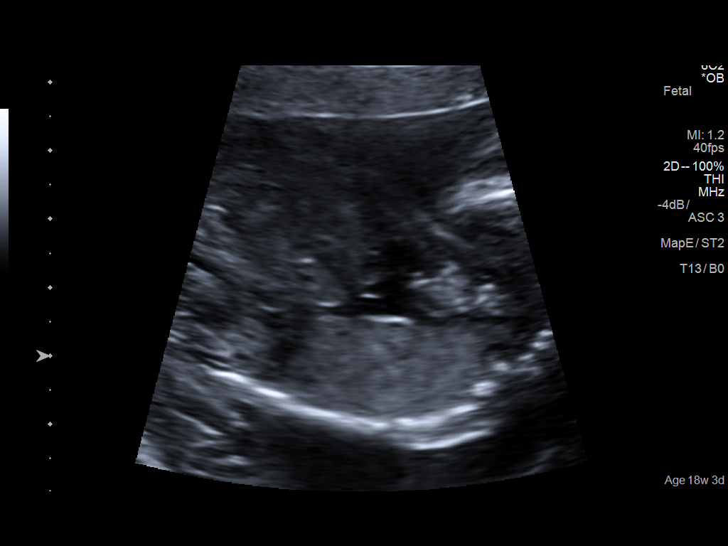
[im 61/97]
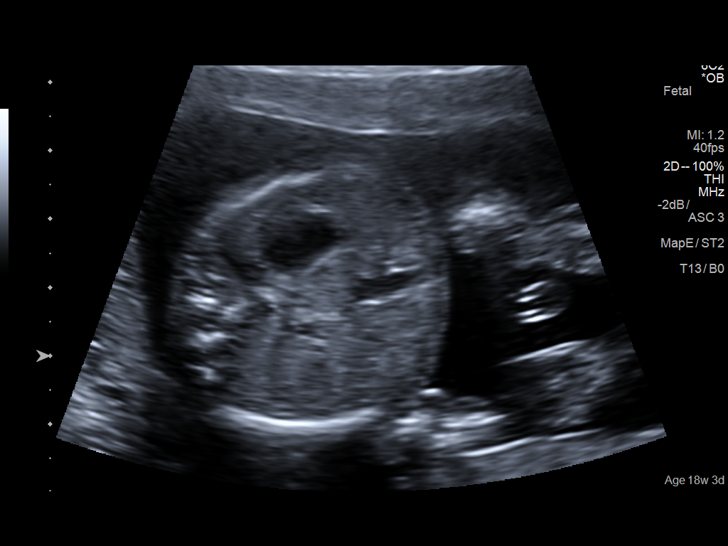
[im 68/97]
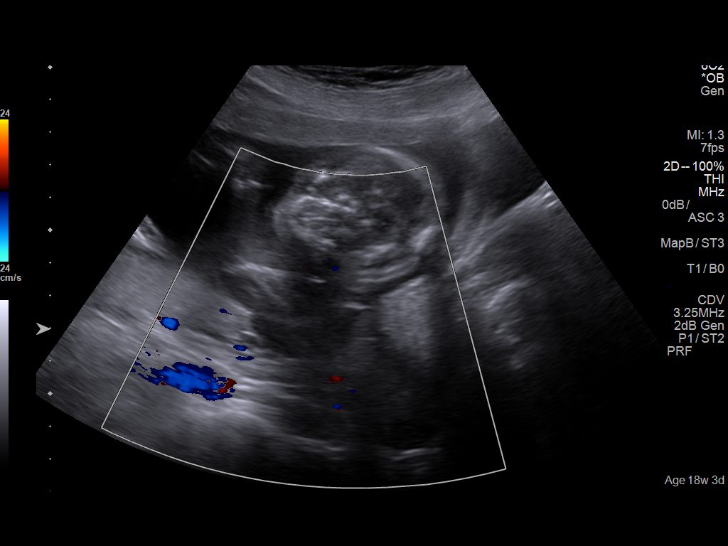
[im 79/97]
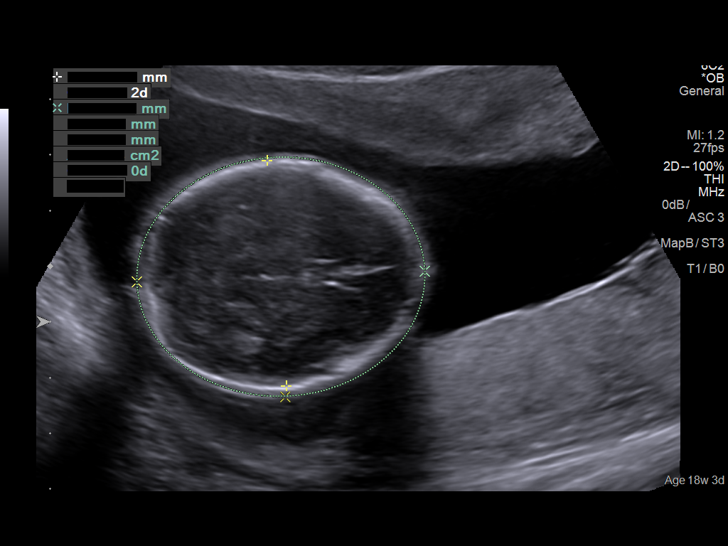
[im 86/97]
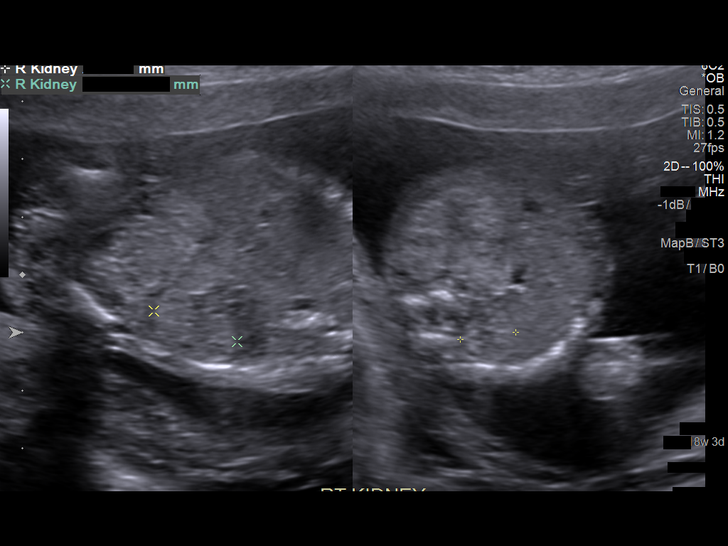
[im 93/97]
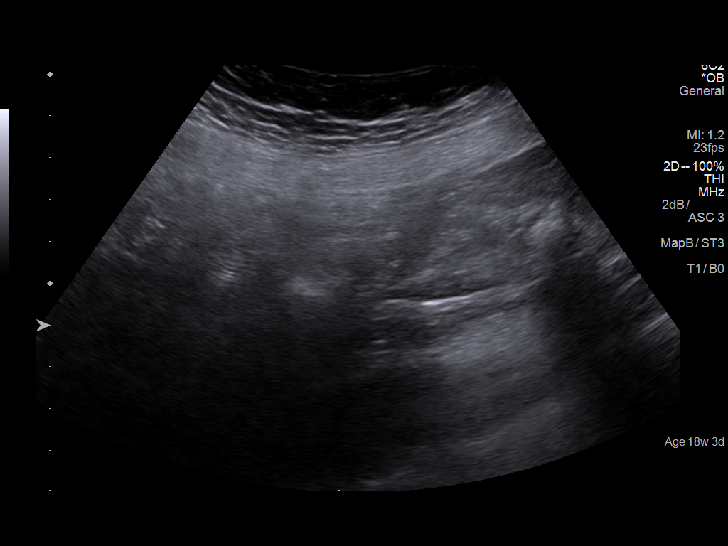

[12 of 28 positions shown; findings below may reference images not displayed]

IMPRESSION: Ms. Joanique presents for detailed ultrasound in setting of
advanced maternal age. She previously completed genetic
counseling and declined aneuploidy testing.

Ultrasound demonstrates a single live pregnancy at 18 [DATE]
weeks. Dating is by LMP consistent with earliest available
ultrasound performed at [facility] on 10/24/17 with
measurements of 12 [DATE] weeks.

The short-axis, RVOT and LVOT views of the heart are
suboptimally visualized. The remainder of the visualized fetal
anatomy appears normal.  The amniotic fluid volume is
normal and there is no placenta previa.

The findings were discussed. Ms. Joanique declines cell-
free fetal DNA testing and amniocentesis.  We have
scheduled her for a fetal echo to be done in 4-5 weeks with
[facility].

## 2020-05-10 ENCOUNTER — Ambulatory Visit: Payer: Medicaid Other | Attending: Internal Medicine

## 2020-05-10 DIAGNOSIS — Z23 Encounter for immunization: Secondary | ICD-10-CM

## 2020-05-10 NOTE — Progress Notes (Signed)
° °  Covid-19 Vaccination Clinic  Name:  Nancy Huang    MRN: [id number] DOB: 1976-04-28  05/10/2020  [name] was observed post Covid-19 immunization for 15 minutes without incident. She was provided with Vaccine Information Sheet and instruction to access the V-Safe system.   [name] was instructed to call 911 with any severe reactions post vaccine:  Difficulty breathing   Swelling of face and throat   A fast heartbeat   A bad rash all over body   Dizziness and weakness   Immunizations Administered    Name Date Dose VIS Date Route   Pfizer COVID-19 Vaccine 05/10/2020 11:57 AM 0.3 mL 04/09/2020 Intramuscular   Manufacturer: [employer], [employer]   Lot: [id number]   NDC: [address]-6
